# Patient Record
Sex: Male | Born: 1970
Health system: Southern US, Community
[De-identification: ages and names within clinical notes are randomized; demographics above are authoritative.]

## PROBLEM LIST (undated history)

## (undated) DIAGNOSIS — M109 Gout, unspecified: Secondary | ICD-10-CM

## (undated) DIAGNOSIS — K219 Gastro-esophageal reflux disease without esophagitis: Secondary | ICD-10-CM

## (undated) DIAGNOSIS — F419 Anxiety disorder, unspecified: Secondary | ICD-10-CM

## (undated) HISTORY — DX: Anxiety disorder, unspecified: F41.9

## (undated) HISTORY — PX: MOUTH SURGERY: SHX715

## (undated) HISTORY — DX: Gastro-esophageal reflux disease without esophagitis: K21.9

## (undated) HISTORY — DX: Gout, unspecified: M10.9

---

## 2003-07-04 ENCOUNTER — Emergency Department (HOSPITAL_COMMUNITY): Admission: EM | Admit: 2003-07-04 | Discharge: 2003-07-04 | Payer: Self-pay | Admitting: Emergency Medicine

## 2005-06-18 IMAGING — CR DG CHEST 2V
2 series · 2 of 2 positions shown · non-contrast
Comparison: none

CLINICAL DATA: Chest tightness and dyspnea.  
 PA AND LATERAL CHEST
 No comparison.  
 The cardiomediastinal contours are normal.  The lungs are clear.  There is no pleural effusion or pneumothorax.  Osseous structures appear unremarkable.  
 IMPRESSION
 No active cardiopulmonary process demonstrated.

[view not recorded (1 of 2)]
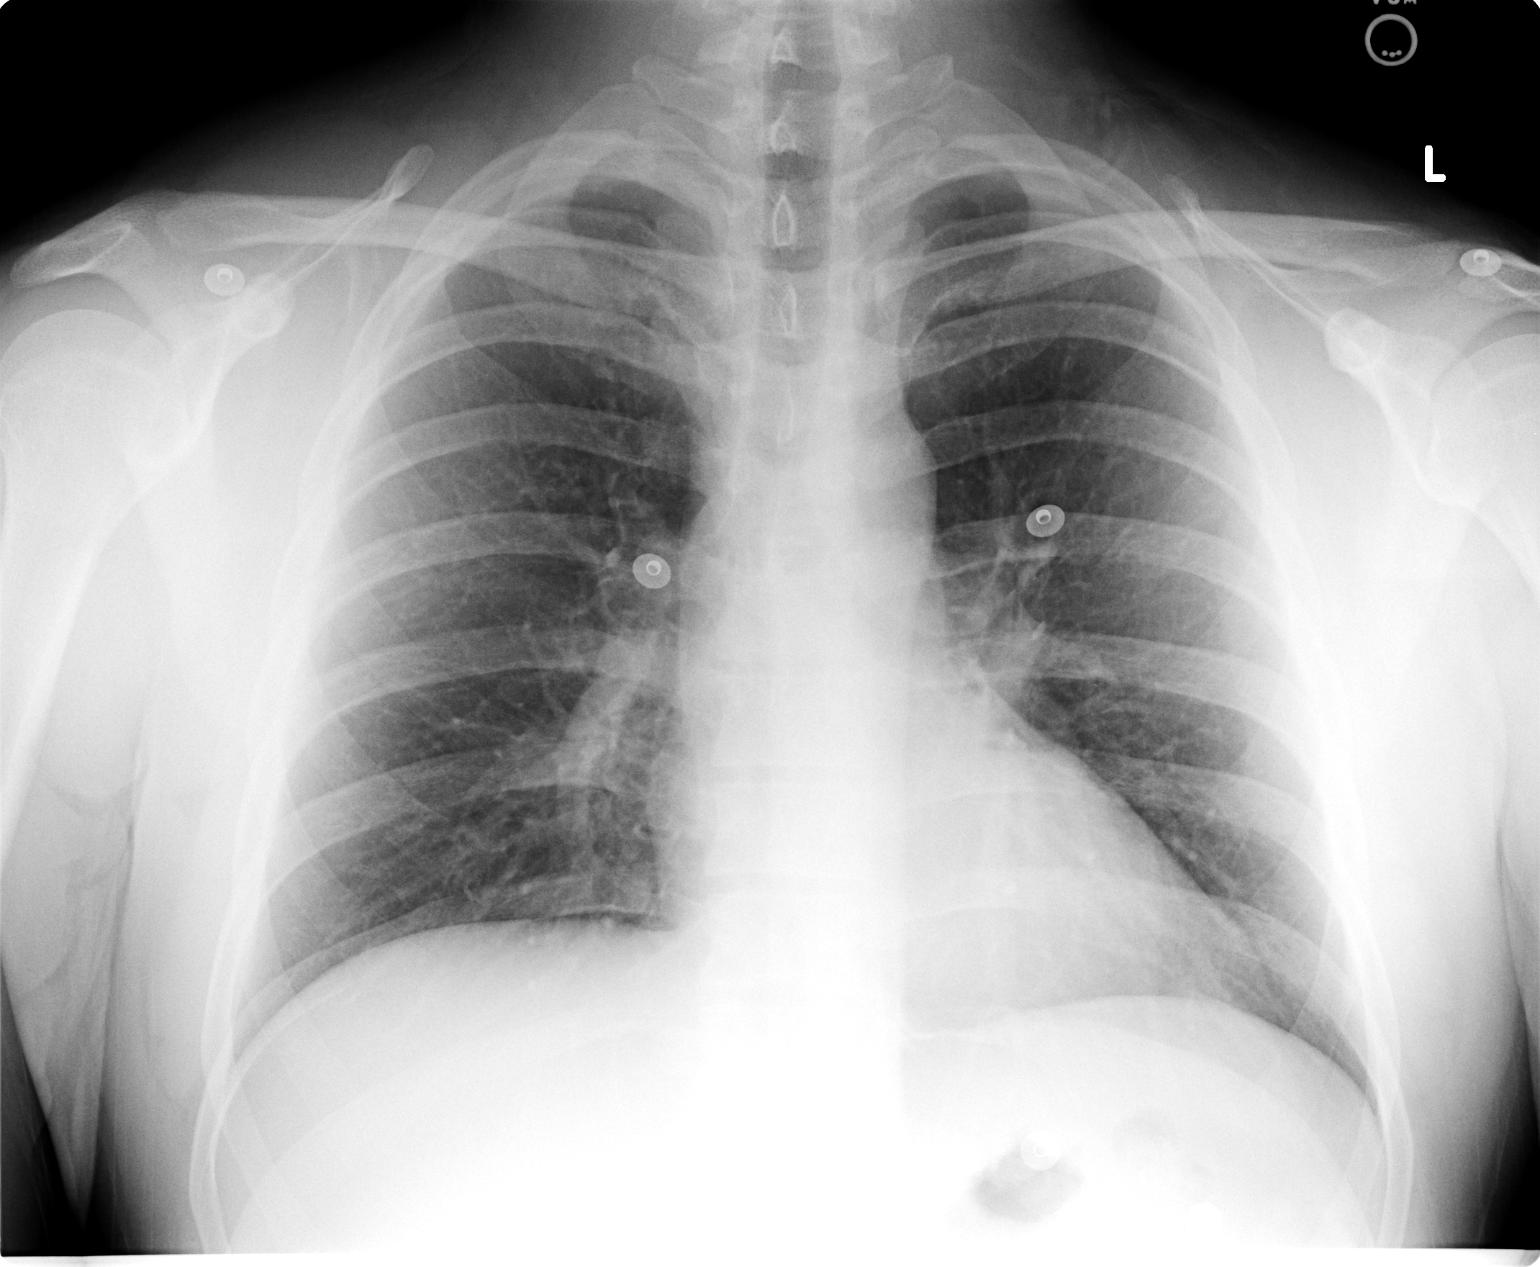

[view not recorded (2 of 2)]
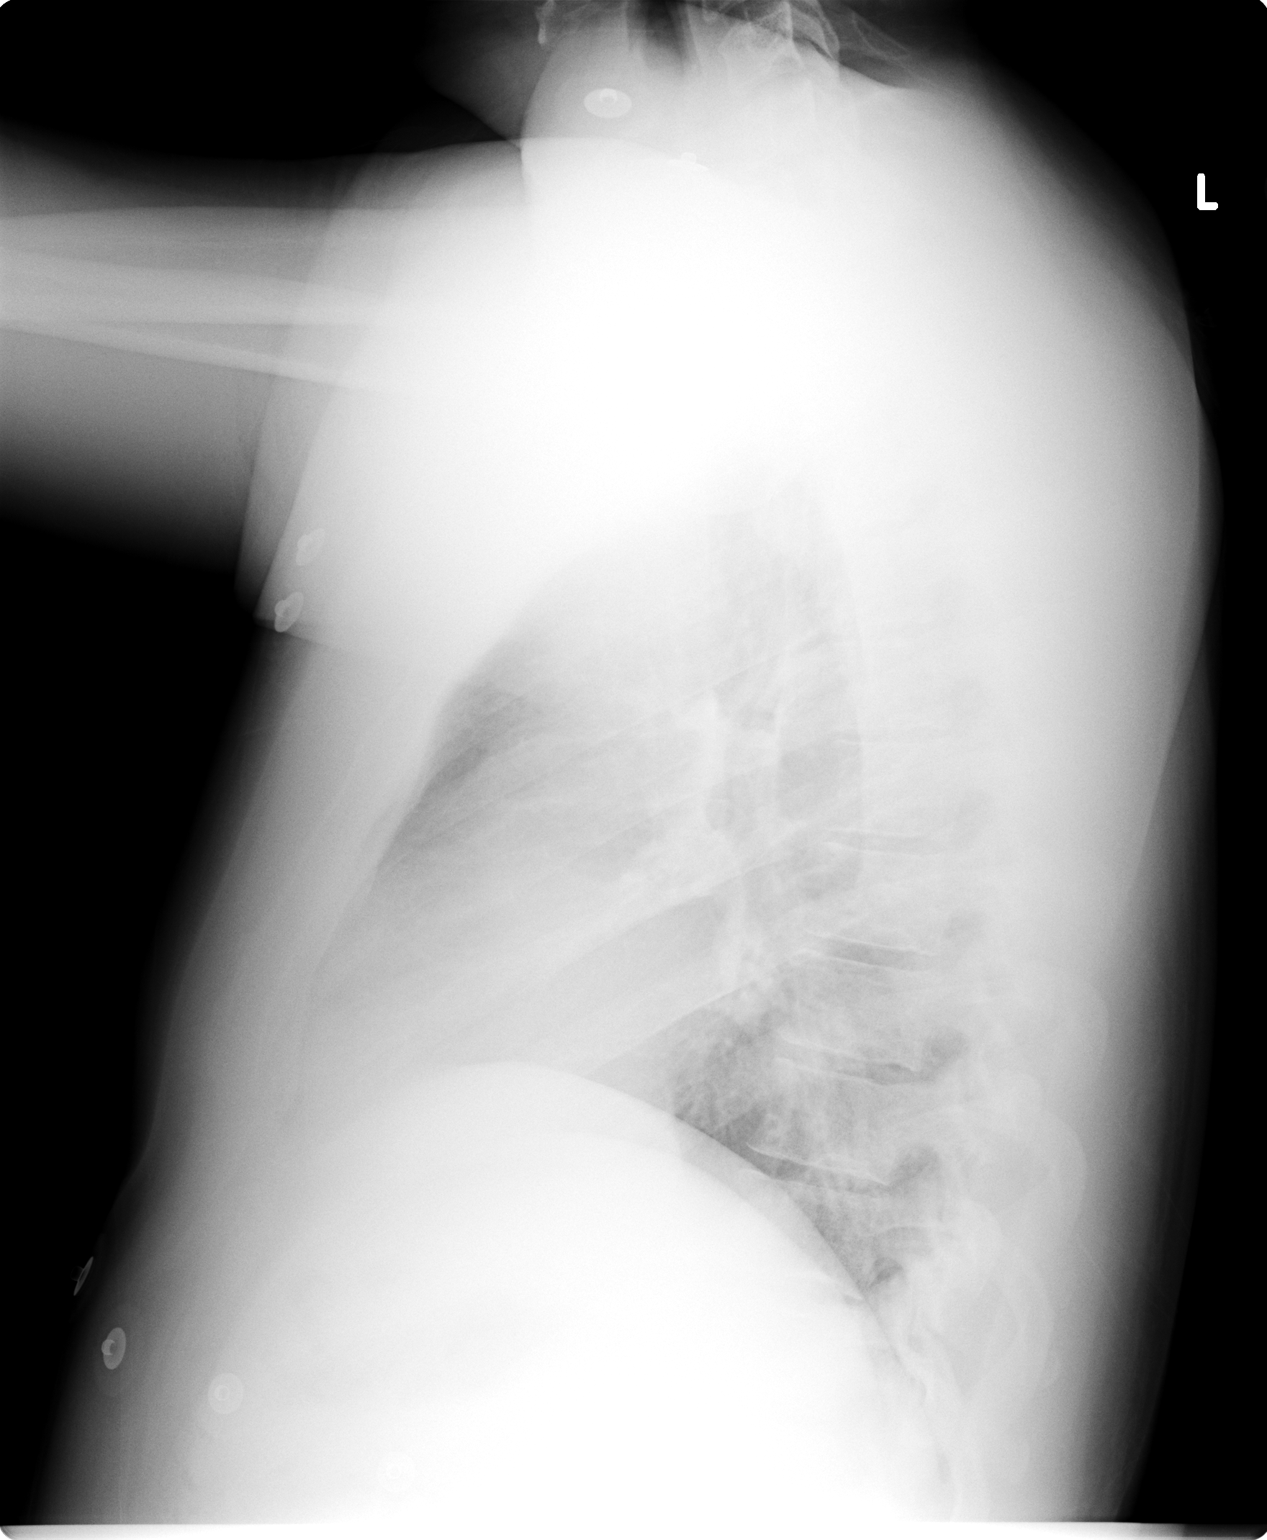

[2 of 2 positions shown; findings below may reference images not displayed]

## 2018-02-19 DIAGNOSIS — J3089 Other allergic rhinitis: Secondary | ICD-10-CM | POA: Diagnosis not present

## 2018-02-19 DIAGNOSIS — K219 Gastro-esophageal reflux disease without esophagitis: Secondary | ICD-10-CM | POA: Diagnosis not present

## 2018-02-19 DIAGNOSIS — Z0000 Encounter for general adult medical examination without abnormal findings: Secondary | ICD-10-CM | POA: Diagnosis not present

## 2018-03-02 DIAGNOSIS — J0100 Acute maxillary sinusitis, unspecified: Secondary | ICD-10-CM | POA: Diagnosis not present

## 2018-04-08 DIAGNOSIS — J111 Influenza due to unidentified influenza virus with other respiratory manifestations: Secondary | ICD-10-CM | POA: Diagnosis not present

## 2021-09-16 ENCOUNTER — Other Ambulatory Visit: Payer: Self-pay | Admitting: Oncology

## 2021-09-16 DIAGNOSIS — D582 Other hemoglobinopathies: Secondary | ICD-10-CM

## 2021-09-16 NOTE — Progress Notes (Signed)
[facility]  [address] [place],  [place]  [address] [phone]  Clinic Day:  09/17/2021  Referring physician: Gweneth Fritter, FNP   HISTORY OF PRESENT ILLNESS:  The patient is a 51 y.o. male  who I was asked to consult upon for polycythemia.  Recent labs showed an elevated hemoglobin and hematocrit of 19.7 and 57.4, respectively.  His platelets were also elevated at 528.  According to the patient, he was seen approximately 10 years ago for mild thrombocythemia in [place].  However, an exhaustive work-up was not done at that time.  The patient claims he went into emergency room 2 months ago when again having dizzy spells and numbness in his lips.  Labs at that time revealed polycythemia and thrombocytopenia.  Fortunately, those symptoms have not redeveloped.  He denies taking any type of testosterone or anabolic steroids.  He also denies being placed on any diuretics that can cause pseudopolycythemia.  He denies having undergone a previous splenectomy, which can lead to chronically elevated peripheral counts.  He also denies having any B symptoms which concern him for an underlying hematologic malignancy being behind his elevated peripheral counts.  Of note, his mother is currently battling multiple myeloma.  His maternal grandfather had leukemia.  PAST MEDICAL HISTORY:   Past Medical History:  Diagnosis Date   Anxiety    GERD (gastroesophageal reflux disease)    Gout     PAST SURGICAL HISTORY:  Oral surgery  CURRENT MEDICATIONS:   Current Outpatient Medications  Medication Sig Dispense Refill   Flaxseed, Linseed, (FLAX SEED OIL) 1000 MG CAPS Take by mouth.     fluticasone (FLONASE) 50 MCG/ACT nasal spray Administer 1 spray in each nostril daily.     Multiple Vitamin (MULTI-VITAMIN) tablet Take 1 tablet by mouth daily.     No current facility-administered medications for this visit.    ALLERGIES:   Allergies  Allergen Reactions    Ciprofloxacin Nausea And Vomiting   Hydromorphone Rash    FAMILY HISTORY:   Family History  Problem Relation Age of Onset   Multiple myeloma Mother    Hypertension Mother    Diabetes Mother    Heart failure Mother    CVA Mother    Alcohol abuse Father    Leukemia Maternal Grandfather     SOCIAL HISTORY:  The patient was born and raised in [place].  He currently lives in [place] with his wife of 25 years.  They have 2 children.  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  He drinks alcohol on rare occasions.  He did smoke a few cigarettes for 6 months before quitting 3 years ago.  REVIEW OF SYSTEMS:  Review of Systems  Constitutional:  Negative for fatigue, fever and unexpected weight change.  Respiratory:  Positive for cough. Negative for chest tightness, hemoptysis and shortness of breath.   Cardiovascular:  Negative for chest pain and palpitations.  Gastrointestinal:  Negative for abdominal distention, abdominal pain, blood in stool, constipation, diarrhea, nausea and vomiting.  Genitourinary:  Negative for dysuria, frequency and hematuria.   Musculoskeletal:  Negative for arthralgias, back pain and myalgias.  Skin:  Negative for itching and rash.  Neurological:  Positive for dizziness and headaches. Negative for light-headedness.  Psychiatric/Behavioral:  Negative for depression and suicidal ideas. The patient is not nervous/anxious.      PHYSICAL EXAM:  Blood pressure (!) 153/93, pulse 63, temperature 98.4 F (36.9 C), resp. rate 16, height 5'  11" (1.803 m), weight 221 lb (100.2 kg), SpO2 95 %. Wt Readings from Last 3 Encounters:  09/17/21 221 lb (100.2 kg)   Body mass index is 30.82 kg/m. Performance status (ECOG): 0 - Asymptomatic Physical Exam Constitutional:      Appearance: Normal appearance. He is not ill-appearing.  HENT:     Mouth/Throat:     Mouth: Mucous membranes are moist.     Pharynx: Oropharynx is clear. No oropharyngeal exudate or  posterior oropharyngeal erythema.  Cardiovascular:     Rate and Rhythm: Normal rate and regular rhythm.     Heart sounds: No murmur heard.    No friction rub. No gallop.  Pulmonary:     Effort: Pulmonary effort is normal. No respiratory distress.     Breath sounds: Normal breath sounds. No wheezing, rhonchi or rales.  Abdominal:     General: Bowel sounds are normal. There is no distension.     Palpations: Abdomen is soft. There is no mass.     Tenderness: There is no abdominal tenderness.  Musculoskeletal:        General: No swelling.     Right lower leg: No edema.     Left lower leg: No edema.  Lymphadenopathy:     Cervical: No cervical adenopathy.     Upper Body:     Right upper body: No supraclavicular or axillary adenopathy.     Left upper body: No supraclavicular or axillary adenopathy.     Lower Body: No right inguinal adenopathy. No left inguinal adenopathy.  Skin:    General: Skin is warm.     Coloration: Skin is not jaundiced.     Findings: No lesion or rash.  Neurological:     General: No focal deficit present.     Mental Status: He is alert and oriented to person, place, and time. Mental status is at baseline.  Psychiatric:        Mood and Affect: Mood normal.        Behavior: Behavior normal.        Thought Content: Thought content normal.     LABS:        Latest Ref Rng & Units 09/17/2021   12:00 AM  CMP  BUN 4 - 21 11      Creatinine 0.6 - 1.3 1.0      Sodium 137 - 147 138      Potassium 3.5 - 5.1 mEq/L 4.0      Chloride 99 - 108 101      CO2 13 - 22 29      Calcium 8.7 - 10.7 9.4      Alkaline Phos 25 - 125 71      AST 14 - 40 38      ALT 10 - 40 U/L 34         This result is from an external source.    ASSESSMENT & PLAN:  A 51 y.o. male who I was asked to consult upon for polycythemia.  As mentioned previously thrombocythemia has also been an issue.  As 2 of his 3 major blood cell lines are elevated, I will do JAK2 mutation today to ensure  polycythemia rubra vera is not present.  I will also have him tested for calreticulin and MPL mutations, particularly if his JAK2 mutation testing comes back negative.  My overall concern is an underlying myeloproliferative disorder may be present.  I will also check his iron parameters today to ensure that his  elevated hemoglobin is not due to hemochromatosis, which can occasionally lead to polycythemia.  Based upon his history, he is not on any medicines which are known to precipitate secondary polycythemia.  He also appears to not be dehydrated, which can cause polycythemia.  He also has not had a splenectomy, which can lead to chronic polycythemia/panmyelosis.  I will see this patient back in 2 weeks to go over all of his labs collected today, which will be used to formulate his next course of action as it pertains to his polycythemia and thrombocythemia.  The patient understands all the plans discussed today and is in agreement with them.  I do appreciate Goins, Gillis Santa, FNP for his new consult.   Lin Glazier Macarthur Critchley, MD

## 2021-09-17 ENCOUNTER — Inpatient Hospital Stay: Payer: No Typology Code available for payment source | Attending: Oncology | Admitting: Oncology

## 2021-09-17 ENCOUNTER — Inpatient Hospital Stay: Payer: No Typology Code available for payment source

## 2021-09-17 ENCOUNTER — Other Ambulatory Visit: Payer: Self-pay | Admitting: Oncology

## 2021-09-17 ENCOUNTER — Encounter: Payer: Self-pay | Admitting: Oncology

## 2021-09-17 DIAGNOSIS — D75839 Thrombocytosis, unspecified: Secondary | ICD-10-CM | POA: Diagnosis not present

## 2021-09-17 DIAGNOSIS — Z833 Family history of diabetes mellitus: Secondary | ICD-10-CM | POA: Diagnosis not present

## 2021-09-17 DIAGNOSIS — R059 Cough, unspecified: Secondary | ICD-10-CM

## 2021-09-17 DIAGNOSIS — D751 Secondary polycythemia: Secondary | ICD-10-CM | POA: Diagnosis present

## 2021-09-17 DIAGNOSIS — D582 Other hemoglobinopathies: Secondary | ICD-10-CM

## 2021-09-17 DIAGNOSIS — D45 Polycythemia vera: Secondary | ICD-10-CM

## 2021-09-17 DIAGNOSIS — Z808 Family history of malignant neoplasm of other organs or systems: Secondary | ICD-10-CM | POA: Diagnosis not present

## 2021-09-17 DIAGNOSIS — R519 Headache, unspecified: Secondary | ICD-10-CM

## 2021-09-17 DIAGNOSIS — Z806 Family history of leukemia: Secondary | ICD-10-CM | POA: Diagnosis not present

## 2021-09-17 DIAGNOSIS — Z79899 Other long term (current) drug therapy: Secondary | ICD-10-CM | POA: Insufficient documentation

## 2021-09-17 DIAGNOSIS — R42 Dizziness and giddiness: Secondary | ICD-10-CM

## 2021-09-17 DIAGNOSIS — Z8249 Family history of ischemic heart disease and other diseases of the circulatory system: Secondary | ICD-10-CM | POA: Diagnosis not present

## 2021-09-17 DIAGNOSIS — Z87891 Personal history of nicotine dependence: Secondary | ICD-10-CM | POA: Diagnosis not present

## 2021-09-17 DIAGNOSIS — Z811 Family history of alcohol abuse and dependence: Secondary | ICD-10-CM

## 2021-09-17 DIAGNOSIS — R200 Anesthesia of skin: Secondary | ICD-10-CM | POA: Diagnosis not present

## 2021-09-17 LAB — HEPATIC FUNCTION PANEL
ALT: 34 U/L (ref 10–40)
AST: 38 (ref 14–40)
Alkaline Phosphatase: 71 (ref 25–125)
Bilirubin, Total: 1.1

## 2021-09-17 LAB — BASIC METABOLIC PANEL
BUN: 11 (ref 4–21)
CO2: 29 — AB (ref 13–22)
Chloride: 101 (ref 99–108)
Creatinine: 1 (ref 0.6–1.3)
Glucose: 92
Potassium: 4 mEq/L (ref 3.5–5.1)
Sodium: 138 (ref 137–147)

## 2021-09-17 LAB — CBC AND DIFFERENTIAL
HCT: 61 — AB (ref 41–53)
Hemoglobin: 20.4 — AB (ref 13.5–17.5)
Neutrophils Absolute: 6.29
Platelets: 468 10*3/uL — AB (ref 150–400)
WBC: 8.5

## 2021-09-17 LAB — IRON AND TIBC
Iron: 121 ug/dL (ref 45–182)
Saturation Ratios: 23 % (ref 17.9–39.5)
TIBC: 523 ug/dL — ABNORMAL HIGH (ref 250–450)
UIBC: 402 ug/dL

## 2021-09-17 LAB — COMPREHENSIVE METABOLIC PANEL
Albumin: 4.8 (ref 3.5–5.0)
Calcium: 9.4 (ref 8.7–10.7)

## 2021-09-17 LAB — CBC: RBC: 6.84 — AB (ref 3.87–5.11)

## 2021-09-17 LAB — FERRITIN: Ferritin: 12 ng/mL — ABNORMAL LOW (ref 24–336)

## 2021-09-19 DIAGNOSIS — D751 Secondary polycythemia: Secondary | ICD-10-CM | POA: Diagnosis not present

## 2021-09-21 LAB — JAK2 GENOTYPR

## 2021-09-26 LAB — NGS JAK2 EXONS 12-15: E12-15 %: 49.45 %

## 2021-10-02 NOTE — Progress Notes (Signed)
[facility]  [address] [place],  [place]  [address] [phone]  Clinic Day:  10/03/2021  Referring physician: Gweneth Fritter, FNP   HISTORY OF PRESENT ILLNESS:  The patient is a 51 y.o. male  who I  recently began polycythemia.  He comes in today to go over all of his recent labs to determine the etiology behind this.  Since his last visit, the patient has been doing well.  Despite his elevated red cell indices, he denies having any recent headaches, dizziness, or other hyperviscosity-type symptoms related to his polycythemia.   PHYSICAL EXAM:  Blood pressure (!) 151/98, pulse 67, temperature 98.9 F (37.2 C), resp. rate 16, height '5\' 11"'$  (1.803 m), weight 220 lb 3.2 oz (99.9 kg), SpO2 95 %. Wt Readings from Last 3 Encounters:  10/03/21 220 lb 3.2 oz (99.9 kg)  09/17/21 221 lb (100.2 kg)   Body mass index is 30.71 kg/m. Performance status (ECOG): 0 - Asymptomatic Physical Exam Constitutional:      Appearance: Normal appearance. He is not ill-appearing.  HENT:     Mouth/Throat:     Mouth: Mucous membranes are moist.     Pharynx: Oropharynx is clear. No oropharyngeal exudate or posterior oropharyngeal erythema.  Cardiovascular:     Rate and Rhythm: Normal rate and regular rhythm.     Heart sounds: No murmur heard.    No friction rub. No gallop.  Pulmonary:     Effort: Pulmonary effort is normal. No respiratory distress.     Breath sounds: Normal breath sounds. No wheezing, rhonchi or rales.  Abdominal:     General: Bowel sounds are normal. There is no distension.     Palpations: Abdomen is soft. There is no mass.     Tenderness: There is no abdominal tenderness.  Musculoskeletal:        General: No swelling.     Right lower leg: No edema.     Left lower leg: No edema.  Lymphadenopathy:     Cervical: No cervical adenopathy.     Upper Body:     Right upper body: No supraclavicular or axillary adenopathy.     Left upper body: No  supraclavicular or axillary adenopathy.     Lower Body: No right inguinal adenopathy. No left inguinal adenopathy.  Skin:    General: Skin is warm.     Coloration: Skin is not jaundiced.     Findings: No lesion or rash.  Neurological:     General: No focal deficit present.     Mental Status: He is alert and oriented to person, place, and time. Mental status is at baseline.  Psychiatric:        Mood and Affect: Mood normal.        Behavior: Behavior normal.        Thought Content: Thought content normal.     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  Interpretation of this result should be made in the context of  other clinical, morphologic, and cytogenetic findings.    ASSESSMENT & PLAN:  A 51 y.o. male who I was asked to consult upon for polycythemia.  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  The patient understands this is a myeloproliferative disorder where his bone marrow uncontrollably makes too many red blood cells.  Ultimately, the goal of this disease is to get his hematocrit below 45.  Based upon this, I will arrange for him to be phlebotomized once monthly over these next few months to reach that goal.  She also knows to take a baby aspirin on a daily basis to offset any potential clotting complications this myeloproliferative disorder can cause.  Otherwise, I will see her back in 3 months to reassess his red cell indices to see how well he responded to his upcoming monthly phlebotomies.  The patient understands all the plans discussed today and is in agreement with them.  Cherlyn Syring Macarthur Critchley, MD

## 2021-10-03 ENCOUNTER — Telehealth: Payer: Self-pay | Admitting: Oncology

## 2021-10-03 ENCOUNTER — Inpatient Hospital Stay: Payer: No Typology Code available for payment source | Admitting: Oncology

## 2021-10-03 VITALS — BP 151/98 | HR 67 | Temp 98.9°F | Resp 16 | Ht 71.0 in | Wt 220.2 lb

## 2021-10-03 DIAGNOSIS — D45 Polycythemia vera: Secondary | ICD-10-CM

## 2021-10-03 NOTE — Telephone Encounter (Signed)
Per 10/03/21 los next appt scheduled and confirmed with patient 

## 2021-10-09 ENCOUNTER — Inpatient Hospital Stay: Payer: No Typology Code available for payment source

## 2021-10-09 VITALS — BP 139/95 | HR 68 | Temp 97.8°F | Resp 18

## 2021-10-09 DIAGNOSIS — D45 Polycythemia vera: Secondary | ICD-10-CM

## 2021-10-09 DIAGNOSIS — D751 Secondary polycythemia: Secondary | ICD-10-CM | POA: Diagnosis not present

## 2021-10-09 NOTE — Patient Instructions (Signed)

## 2021-10-09 NOTE — Progress Notes (Signed)
Gabriel Huynh presents today for phlebotomy per MD orders. Phlebotomy procedure started at 1422 and ended at 1445. 485 grams removed. Patient observed for 20 minutes after procedure without any incident. Patient tolerated procedure well. IV needle removed intact.

## 2021-11-09 ENCOUNTER — Inpatient Hospital Stay: Payer: No Typology Code available for payment source | Attending: Oncology

## 2021-11-09 VITALS — BP 130/88 | HR 63 | Temp 98.0°F | Resp 18

## 2021-11-09 DIAGNOSIS — D45 Polycythemia vera: Secondary | ICD-10-CM | POA: Insufficient documentation

## 2021-11-09 NOTE — Patient Instructions (Signed)

## 2021-11-09 NOTE — Progress Notes (Signed)
Gabriel Huynh presents today for phlebotomy per MD orders. Phlebotomy procedure started at 1405 and ended at 1422. 485 grams removed. Patient observed for 5 minutes after procedure without any incident. Patient tolerated procedure well. IV needle removed intact. LEFT AC

## 2021-12-10 ENCOUNTER — Inpatient Hospital Stay: Payer: No Typology Code available for payment source | Attending: Oncology

## 2021-12-10 VITALS — BP 139/81 | HR 88 | Temp 98.3°F | Resp 18 | Wt 223.0 lb

## 2021-12-10 DIAGNOSIS — D45 Polycythemia vera: Secondary | ICD-10-CM

## 2021-12-10 NOTE — Progress Notes (Signed)
Gabriel Huynh presents today for phlebotomy per MD orders. Phlebotomy procedure started at 1410  and ended at 1433 . 480 cc removed. Patient tolerated procedure well. IV needle removed intact.

## 2021-12-10 NOTE — Patient Instructions (Signed)

## 2022-01-03 ENCOUNTER — Ambulatory Visit: Payer: No Typology Code available for payment source | Admitting: Oncology

## 2022-01-03 ENCOUNTER — Inpatient Hospital Stay: Payer: No Typology Code available for payment source

## 2022-01-03 ENCOUNTER — Other Ambulatory Visit: Payer: Self-pay | Admitting: Oncology

## 2022-01-03 ENCOUNTER — Other Ambulatory Visit: Payer: No Typology Code available for payment source

## 2022-01-03 ENCOUNTER — Inpatient Hospital Stay (INDEPENDENT_AMBULATORY_CARE_PROVIDER_SITE_OTHER): Payer: No Typology Code available for payment source | Admitting: Oncology

## 2022-01-03 ENCOUNTER — Inpatient Hospital Stay: Payer: No Typology Code available for payment source | Attending: Oncology

## 2022-01-03 VITALS — BP 137/94 | HR 64 | Temp 98.9°F | Resp 18 | Wt 222.0 lb

## 2022-01-03 VITALS — BP 150/94 | HR 64 | Temp 97.9°F | Resp 16 | Ht 71.0 in | Wt 223.4 lb

## 2022-01-03 DIAGNOSIS — D45 Polycythemia vera: Secondary | ICD-10-CM | POA: Insufficient documentation

## 2022-01-03 DIAGNOSIS — Z79899 Other long term (current) drug therapy: Secondary | ICD-10-CM | POA: Insufficient documentation

## 2022-01-03 DIAGNOSIS — D471 Chronic myeloproliferative disease: Secondary | ICD-10-CM | POA: Insufficient documentation

## 2022-01-03 DIAGNOSIS — Z7982 Long term (current) use of aspirin: Secondary | ICD-10-CM | POA: Insufficient documentation

## 2022-01-03 LAB — CBC: RBC: 6.79 — AB (ref 3.87–5.11)

## 2022-01-03 LAB — CBC AND DIFFERENTIAL
HCT: 54 — AB (ref 41–53)
Hemoglobin: 17.8 — AB (ref 13.5–17.5)
Neutrophils Absolute: 6.31
Platelets: 503 10*3/uL — AB (ref 150–400)
WBC: 8.2

## 2022-01-03 NOTE — Patient Instructions (Addendum)
Return to Work Gabriel Huynh was treated at our facility today 01/02/22 Injury or illness was: ___Work-related. _x__Not work-related. ___Undetermined if work-related. Return to work Employee may return to work on today   Work activity restrictions: None    This information is not intended to replace advice given to you by your health care provider. Make sure you discuss any questions you have with your health care provider. Document Revised: 11/22/2020 Document Reviewed: 11/22/2020 Elsevier Patient Education  [name].

## 2022-01-03 NOTE — Progress Notes (Signed)
Gabriel Huynh presents today for phlebotomy per MD orders. Phlebotomy procedure started at 1512 and ended at 1517 500 grams removed. Patient stable at discharge  Patient tolerated procedure well. IV needle removed intact.

## 2022-01-03 NOTE — Progress Notes (Signed)
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  He has been receiving monthly phlebotomies over these past few months with the goal being to get his hematocrit less than 45.  He comes in today to reassess his red cell indices.  Since his last visit, the patient has been doing well.  He also has tolerated his monthly phlebotomies very well.  He denies having any hyperviscosity-type complications related to his underlying polycythemia rubra vera.  PHYSICAL EXAM:  Blood pressure (!) 150/94, pulse 64, temperature 97.9 F (36.6 C), resp. rate 16, height '5\' 11"'$  (1.803 m), weight 223 lb 6.4 oz (101.3 kg), SpO2 95 %. Wt Readings from Last 3 Encounters:  01/03/22 222 lb (100.7 kg)  01/03/22 223 lb 6.4 oz (101.3 kg)  12/10/21 223 lb (101.2 kg)   Body mass index is 31.16 kg/m. Performance status (ECOG): 0 - Asymptomatic Physical Exam Constitutional:      Appearance: Normal appearance. He is not ill-appearing.  HENT:     Mouth/Throat:     Mouth: Mucous membranes are moist.     Pharynx: Oropharynx is clear. No oropharyngeal exudate or posterior oropharyngeal erythema.  Cardiovascular:     Rate and Rhythm: Normal rate and regular rhythm.     Heart sounds: No murmur heard.    No friction rub. No gallop.  Pulmonary:     Effort: Pulmonary effort is normal. No respiratory distress.     Breath sounds: Normal breath sounds. No wheezing, rhonchi or rales.  Abdominal:     General: Bowel sounds are normal. There is no distension.     Palpations: Abdomen is soft. There is no mass.     Tenderness: There is no abdominal tenderness.  Musculoskeletal:        General: No swelling.     Right lower leg: No edema.     Left lower leg: No  edema.  Lymphadenopathy:     Cervical: No cervical adenopathy.     Upper Body:     Right upper body: No supraclavicular or axillary adenopathy.     Left upper body: No supraclavicular or axillary adenopathy.     Lower Body: No right inguinal adenopathy. No left inguinal adenopathy.  Skin:    General: Skin is warm.     Coloration: Skin is not jaundiced.     Findings: No lesion or rash.  Neurological:     General: No focal deficit present.     Mental Status: He is alert and oriented to person, place, and time. Mental status is at baseline.  Psychiatric:        Mood and Affect: Mood normal.        Behavior: Behavior normal.        Thought Content: Thought content normal.    LABS:    Latest Reference Range & Units 01/03/22 00:00  WBC  8.2 (E)  RBC 3.87 - 5.11  6.79 ! (E)  Hemoglobin 13.5 - 17.5  17.8 ! (E)  HCT 41 - 53  54 ! (E)  Platelets 150 - 400 K/uL 503 ! 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  Interpretation of this result should be made in the context of  other clinical, morphologic, and cytogenetic findings.    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  Although his red cell indices are better today after his monthly phlebotomies, he is still not at the goal level, which is to get his hematocrit below 45.  Based upon this, he will continue to receive monthly phlebotomies over these next few months.  He also knows to continue taking a baby aspirin on a daily basis to offset any potential clotting complications his myeloproliferative disorder can cause.  I will see him back in 3 months to reassess his polycythemia rubra vera. The patient understands all the plans discussed today and is in  agreement with them.  Gabriel Odwyer Macarthur Critchley, MD

## 2022-01-31 ENCOUNTER — Inpatient Hospital Stay: Payer: No Typology Code available for payment source | Attending: Oncology

## 2022-01-31 VITALS — BP 136/81 | HR 70 | Temp 98.2°F | Resp 18

## 2022-01-31 DIAGNOSIS — Z79899 Other long term (current) drug therapy: Secondary | ICD-10-CM | POA: Insufficient documentation

## 2022-01-31 DIAGNOSIS — D45 Polycythemia vera: Secondary | ICD-10-CM | POA: Diagnosis present

## 2022-01-31 NOTE — Progress Notes (Signed)
Gabriel Huynh presents today for phlebotomy per MD orders. Phlebotomy procedure started at 1410 and ended at 1420. 508 grams removed. Patient observed for 10 min after procedure without any incident. Patient tolerated procedure well. IV needle removed intact. LEFT AC

## 2022-01-31 NOTE — Patient Instructions (Signed)

## 2022-02-28 ENCOUNTER — Inpatient Hospital Stay: Payer: No Typology Code available for payment source

## 2022-03-01 ENCOUNTER — Inpatient Hospital Stay: Payer: No Typology Code available for payment source | Attending: Oncology

## 2022-03-01 VITALS — BP 149/87 | HR 80 | Temp 98.0°F | Resp 18

## 2022-03-01 DIAGNOSIS — D45 Polycythemia vera: Secondary | ICD-10-CM | POA: Insufficient documentation

## 2022-03-01 DIAGNOSIS — Z79899 Other long term (current) drug therapy: Secondary | ICD-10-CM | POA: Diagnosis not present

## 2022-03-01 NOTE — Patient Instructions (Signed)

## 2022-04-05 ENCOUNTER — Inpatient Hospital Stay: Payer: No Typology Code available for payment source | Attending: Oncology

## 2022-04-05 ENCOUNTER — Inpatient Hospital Stay: Payer: No Typology Code available for payment source | Admitting: Oncology

## 2022-04-05 ENCOUNTER — Inpatient Hospital Stay: Payer: No Typology Code available for payment source

## 2022-04-05 VITALS — BP 153/94 | HR 89 | Temp 98.9°F | Resp 16 | Ht 71.0 in | Wt 227.2 lb

## 2022-04-05 VITALS — BP 162/100 | HR 78 | Temp 98.0°F | Resp 18

## 2022-04-05 DIAGNOSIS — D45 Polycythemia vera: Secondary | ICD-10-CM | POA: Diagnosis not present

## 2022-04-05 LAB — CBC AND DIFFERENTIAL
HCT: 51 (ref 41–53)
Hemoglobin: 16.9 (ref 13.5–17.5)
Neutrophils Absolute: 6.05
Platelets: 547 10*3/uL — AB (ref 150–400)
WBC: 8.4

## 2022-04-05 LAB — CBC: RBC: 7.33 — AB (ref 3.87–5.11)

## 2022-04-05 NOTE — Progress Notes (Signed)
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  He has been receiving monthly phlebotomies over these past few months with the goal being to get his hematocrit less than 45.  He comes in today to reassess his red cell indices.  Since his last visit, the patient has been doing well.  He has tolerated his monthly phlebotomies very well.  He denies having any hyperviscosity-type complications related to his underlying polycythemia rubra vera.  He also claims to feel better since he started his monthly phlebotomies.    PHYSICAL EXAM:  Blood pressure (!) 153/94, pulse 89, temperature 98.9 F (37.2 C), resp. rate 16, height '5\' 11"'$  (1.803 m), weight 227 lb 3.2 oz (103.1 kg), SpO2 94 %. Wt Readings from Last 3 Encounters:  04/05/22 227 lb 3.2 oz (103.1 kg)  01/03/22 222 lb (100.7 kg)  01/03/22 223 lb 6.4 oz (101.3 kg)   Body mass index is 31.69 kg/m. Performance status (ECOG): 0 - Asymptomatic Physical Exam Constitutional:      Appearance: Normal appearance. He is not ill-appearing.  HENT:     Mouth/Throat:     Mouth: Mucous membranes are moist.     Pharynx: Oropharynx is clear. No oropharyngeal exudate or posterior oropharyngeal erythema.  Cardiovascular:     Rate and Rhythm: Normal rate and regular rhythm.     Heart sounds: No murmur heard.    No friction rub. No gallop.  Pulmonary:     Effort: Pulmonary effort is normal. No respiratory distress.     Breath sounds: Normal breath sounds. No wheezing, rhonchi or rales.  Abdominal:     General: Bowel sounds are normal. There is no distension.     Palpations: Abdomen is soft. There is no mass.     Tenderness: There is no abdominal tenderness.  Musculoskeletal:        General: No  swelling.     Right lower leg: No edema.     Left lower leg: No edema.  Lymphadenopathy:     Cervical: No cervical adenopathy.     Upper Body:     Right upper body: No supraclavicular or axillary adenopathy.     Left upper body: No supraclavicular or axillary adenopathy.     Lower Body: No right inguinal adenopathy. No left inguinal adenopathy.  Skin:    General: Skin is warm.     Coloration: Skin is not jaundiced.     Findings: No lesion or rash.  Neurological:     General: No focal deficit present.     Mental Status: He is alert and oriented to person, place, and time. Mental status is at baseline.  Psychiatric:        Mood and Affect: Mood normal.        Behavior: Behavior normal.        Thought Content: Thought content normal.    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  Interpretation of this result should be made in the context of  other clinical, morphologic, and  cytogenetic findings.    ASSESSMENT & PLAN:  A 52 y.o. 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  Although his red cell indices are better today after his monthly phlebotomies, he is still not at the goal level, which is to get his hematocrit below 45.  Based upon this, he will continue to receive monthly phlebotomies over these next few months.  He also knows to continue taking a baby aspirin on a daily basis to offset any potential clotting complications his myeloproliferative disorder can cause.  I will see him back in 3 months to reassess his polycythemia rubra vera. The patient understands all the plans discussed today and is in agreement with them.  Phill Steck Macarthur Critchley, MD

## 2022-04-05 NOTE — Progress Notes (Signed)
Elson Clan presents today for phlebotomy per MD orders. Phlebotomy procedure started at [facility] and ended at 1625. 491 grams removed. Patient observed for 10 minutes after procedure without any incident. Patient tolerated procedure well. IV needle removed intact. LEFT AC

## 2022-04-07 ENCOUNTER — Encounter: Payer: Self-pay | Admitting: Oncology

## 2022-05-03 ENCOUNTER — Inpatient Hospital Stay: Payer: No Typology Code available for payment source | Attending: Oncology

## 2022-05-03 VITALS — BP 121/80 | HR 68 | Temp 98.0°F | Resp 18

## 2022-05-03 DIAGNOSIS — D45 Polycythemia vera: Secondary | ICD-10-CM | POA: Diagnosis present

## 2022-05-03 NOTE — Patient Instructions (Signed)

## 2022-05-03 NOTE — Progress Notes (Signed)
Elson Clan presents today for phlebotomy per MD orders. Phlebotomy procedure started at 1505 and ended at 1515. 501 grams removed. Patient observed for 5 minutes after procedure without any incident. Patient tolerated procedure well. IV needle removed intact. LEFT AC

## 2022-05-31 ENCOUNTER — Inpatient Hospital Stay: Payer: No Typology Code available for payment source | Attending: Oncology

## 2022-05-31 VITALS — BP 127/81 | HR 79 | Temp 98.1°F | Resp 18 | Ht 71.0 in | Wt 217.0 lb

## 2022-05-31 DIAGNOSIS — D45 Polycythemia vera: Secondary | ICD-10-CM | POA: Diagnosis present

## 2022-05-31 NOTE — Patient Instructions (Signed)

## 2022-05-31 NOTE — Progress Notes (Signed)
Gabriel Huynh presents today for phlebotomy per MD orders. Phlebotomy procedure started at 1547 and ended at 1500. 482 grams removed. Patient observed for 5 minutes after procedure without any incident. Patient tolerated procedure well. IV needle removed intact. LEFT AC

## 2022-07-03 NOTE — Progress Notes (Signed)
[facility]  [address] [place],  [place]  [address] [phone]  Clinic Day:  07/04/2022  Referring physician: Audie Pinto, FNP  HISTORY OF PRESENT ILLNESS:  The patient is a 52 y.o. 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  He has been receiving monthly phlebotomies over these past few months with the goal being to get his hematocrit less than 45.  He comes in today to reassess his red cell indices.  Since his last visit, the patient has been doing well.  He has tolerated his monthly phlebotomies very well.  He denies having any hyperviscosity-type complications related to his underlying polycythemia rubra vera.  He also claims to feel better since he started his monthly phlebotomies.    PHYSICAL EXAM:  Blood pressure 138/89, pulse 95, temperature (!) 97.5 F (36.4 C), resp. rate 16, height  (1.803 m), weight 214 lb (97.1 kg), SpO2 92 %. Wt Readings from Last 3 Encounters:  07/04/22 214 lb (97.1 kg)  05/31/22 217 lb (98.4 kg)  04/05/22 227 lb 3.2 oz (103.1 kg)   Body mass index is 29.85 kg/m. Performance status (ECOG): 0 - Asymptomatic Physical Exam Constitutional:      Appearance: Normal appearance. He is not ill-appearing.  HENT:     Mouth/Throat:     Mouth: Mucous membranes are moist.     Pharynx: Oropharynx is clear. No oropharyngeal exudate or posterior oropharyngeal erythema.  Cardiovascular:     Rate and Rhythm: Normal rate and regular rhythm.     Heart sounds: No murmur heard.    No friction rub. No gallop.  Pulmonary:     Effort: Pulmonary effort is normal. No respiratory distress.     Breath sounds: Normal breath sounds. No wheezing, rhonchi or rales.  Abdominal:     General: Bowel sounds are normal. There is no distension.     Palpations: Abdomen is soft. There is no mass.     Tenderness: There is no abdominal tenderness.  Musculoskeletal:        General: No swelling.      Right lower leg: No edema.     Left lower leg: No edema.  Lymphadenopathy:     Cervical: No cervical adenopathy.     Upper Body:     Right upper body: No supraclavicular or axillary adenopathy.     Left upper body: No supraclavicular or axillary adenopathy.     Lower Body: No right inguinal adenopathy. No left inguinal adenopathy.  Skin:    General: Skin is warm.     Coloration: Skin is not jaundiced.     Findings: No lesion or rash.  Neurological:     General: No focal deficit present.     Mental Status: He is alert and oriented to person, place, and time. Mental status is at baseline.  Psychiatric:        Mood and Affect: Mood normal.        Behavior: Behavior normal.        Thought Content: Thought content normal.    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  Interpretation of this result should be made in the context of  other clinical, morphologic, and cytogenetic findings.  ASSESSMENT & PLAN:  A 52 y.o. 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  Although his red cell indices are better today after his monthly phlebotomies, he is still not at the goal level, which is to get his hematocrit below 45.  Based upon this, he will continue be phlebotomized, but they will be spaced out to every 2 months.  He will be  phlebotomized today, with the next one being in June 2024.  I will see him back in 4 months to reassess his polycythemia rubra vera. The patient understands all the plans discussed today and is in agreement with them.  Sanda Dejoy Kirby Funk, MD

## 2022-07-04 ENCOUNTER — Inpatient Hospital Stay: Payer: No Typology Code available for payment source | Attending: Oncology

## 2022-07-04 ENCOUNTER — Inpatient Hospital Stay (INDEPENDENT_AMBULATORY_CARE_PROVIDER_SITE_OTHER): Payer: No Typology Code available for payment source | Admitting: Oncology

## 2022-07-04 ENCOUNTER — Inpatient Hospital Stay: Payer: No Typology Code available for payment source

## 2022-07-04 VITALS — BP 128/71 | HR 88 | Temp 98.0°F | Resp 18

## 2022-07-04 VITALS — BP 138/89 | HR 95 | Temp 97.5°F | Resp 16 | Ht 71.0 in | Wt 214.0 lb

## 2022-07-04 DIAGNOSIS — D45 Polycythemia vera: Secondary | ICD-10-CM

## 2022-07-04 LAB — CBC AND DIFFERENTIAL
HCT: 49 (ref 41–53)
Hemoglobin: 15.4 (ref 13.5–17.5)
Neutrophils Absolute: 8.4
Platelets: 638 10*3/uL — AB (ref 150–400)
WBC: 10.5

## 2022-07-04 LAB — CBC: RBC: 7.19 — AB (ref 3.87–5.11)

## 2022-07-04 NOTE — Progress Notes (Signed)
Gabriel Huynh presents today for phlebotomy per MD orders. Phlebotomy procedure started at 1505 and ended at 1522. 502 grams removed. Patient observed for 5 minutes after procedure without any incident. Patient tolerated procedure well. IV needle removed intact. LEFT AC

## 2022-07-04 NOTE — Patient Instructions (Signed)

## 2022-07-06 ENCOUNTER — Encounter: Payer: Self-pay | Admitting: Oncology

## 2022-09-03 ENCOUNTER — Inpatient Hospital Stay: Payer: No Typology Code available for payment source | Attending: Oncology

## 2022-09-03 VITALS — BP 138/89 | HR 80 | Temp 98.4°F | Resp 16 | Ht 71.0 in | Wt 219.1 lb

## 2022-09-03 DIAGNOSIS — D45 Polycythemia vera: Secondary | ICD-10-CM | POA: Diagnosis present

## 2022-09-03 NOTE — Patient Instructions (Signed)

## 2022-09-03 NOTE — Progress Notes (Signed)
Gabriel Huynh presents today for phlebotomy per MD orders. Phlebotomy procedure started at 1515 and ended at 1528. 485 grams removed. Patient observed after procedure without any incident. Patient tolerated procedure well. IV needle removed intact LEFT AC

## 2022-10-28 ENCOUNTER — Encounter: Payer: Self-pay | Admitting: Oncology

## 2022-11-03 NOTE — Progress Notes (Unsigned)
[facility]  [address] [place],  [place]  [address] [phone]  Clinic Day:  11/04/2022  Referring physician: Audie Pinto, FNP  HISTORY OF PRESENT ILLNESS:  The patient is a 52 y.o. 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  He has been receiving bimonthly phlebotomies over these past few months with the goal being to get his hematocrit less than 45.  He comes in today to reassess his red cell indices.  Since his last visit, the patient has been doing well.  He has tolerated his i phlebotomies very well.  He denies having any hyperviscosity-type complications related to his underlying polycythemia rubra vera.  He also claims to feel better since he started his phlebotomies.    PHYSICAL EXAM:  Blood pressure (!) 146/89, pulse 71, temperature 98.5 F (36.9 C), resp. rate 18, height 5\' 11"  (1.803 m), weight 223 lb 8 oz (101.4 kg), SpO2 94%. Wt Readings from Last 3 Encounters:  11/04/22 223 lb 0.6 oz (101.2 kg)  11/04/22 223 lb 8 oz (101.4 kg)  09/03/22 219 lb 1.3 oz (99.4 kg)   Body mass index is 31.17 kg/m. Performance status (ECOG): 0 - Asymptomatic Physical Exam Constitutional:      Appearance: Normal appearance. He is not ill-appearing.  HENT:     Mouth/Throat:     Mouth: Mucous membranes are moist.     Pharynx: Oropharynx is clear. No oropharyngeal exudate or posterior oropharyngeal erythema.  Cardiovascular:     Rate and Rhythm: Normal rate and regular rhythm.     Heart sounds: No murmur heard.    No friction rub. No gallop.  Pulmonary:     Effort: Pulmonary effort is normal. No respiratory distress.     Breath sounds: Normal breath sounds. No wheezing, rhonchi or rales.  Abdominal:     General: Bowel sounds are normal. There is no distension.     Palpations: Abdomen is soft. There is no mass.     Tenderness: There is no abdominal tenderness.  Musculoskeletal:        General: No swelling.      Right lower leg: No edema.     Left lower leg: No edema.  Lymphadenopathy:     Cervical: No cervical adenopathy.     Upper Body:     Right upper body: No supraclavicular or axillary adenopathy.     Left upper body: No supraclavicular or axillary adenopathy.     Lower Body: No right inguinal adenopathy. No left inguinal adenopathy.  Skin:    General: Skin is warm.     Coloration: Skin is not jaundiced.     Findings: No lesion or rash.  Neurological:     General: No focal deficit present.     Mental Status: He is alert and oriented to person, place, and time. Mental status is at baseline.  Psychiatric:        Mood and Affect: Mood normal.        Behavior: Behavior normal.        Thought Content: Thought content normal.    LABS:    Latest Reference Range & Units 11/04/22 00:00  WBC  9.1 (E)  RBC 3.87 - 5.11  6.78 ! (E)  Hemoglobin 13.5 - 17.5  14.6 (E)  HCT 41 - 53  46 (E)  Platelets 150 - 400 K/uL 633 ! 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  Interpretation of this result should be made in the context of  other clinical, morphologic, and cytogenetic findings.    ASSESSMENT & PLAN:  A 52 y.o. 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  As his hematocrit is very close to being below 45, I will arrange for him to be phlebotomized today.  I will not plan any more phlebotomies over these next handful of months.  I will see him back in 4 months to reassess the status of his polycythemia rubra vera..  The patient understands all the plans discussed today and is in agreement with them.  Adalynne Steffensmeier Kirby Funk, MD

## 2022-11-04 ENCOUNTER — Inpatient Hospital Stay: Payer: Managed Care, Other (non HMO)

## 2022-11-04 ENCOUNTER — Other Ambulatory Visit: Payer: Self-pay | Admitting: Oncology

## 2022-11-04 ENCOUNTER — Inpatient Hospital Stay: Payer: Managed Care, Other (non HMO) | Attending: Oncology

## 2022-11-04 ENCOUNTER — Inpatient Hospital Stay: Payer: Managed Care, Other (non HMO) | Admitting: Oncology

## 2022-11-04 VITALS — BP 128/73 | HR 65 | Temp 98.2°F | Resp 18 | Ht 71.0 in | Wt 223.0 lb

## 2022-11-04 VITALS — BP 146/89 | HR 71 | Temp 98.5°F | Resp 18 | Ht 71.0 in | Wt 223.5 lb

## 2022-11-04 DIAGNOSIS — D45 Polycythemia vera: Secondary | ICD-10-CM

## 2022-11-04 LAB — CBC AND DIFFERENTIAL
HCT: 46 (ref 41–53)
Hemoglobin: 14.6 (ref 13.5–17.5)
Neutrophils Absolute: 6.73
Platelets: 633 10*3/uL — AB (ref 150–400)
WBC: 9.1

## 2022-11-04 LAB — CBC: RBC: 6.78 — AB (ref 3.87–5.11)

## 2022-11-04 NOTE — Progress Notes (Unsigned)
Gabriel Huynh presents today for phlebotomy per MD orders. Phlebotomy procedure started at 1515 and ended at 1525. 502 grams removed. Patient observed for 8 minutes after procedure without any incident. Patient tolerated procedure well. IV needle removed intact. LEFT AC

## 2023-03-05 NOTE — Progress Notes (Unsigned)
[facility]  [address] [place],  [place]  [address] [phone]  Clinic Day:  11/04/2022  Referring physician: Audie Pinto, FNP  HISTORY OF PRESENT ILLNESS:  The patient is a 52 y.o. 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  He has been receiving bimonthly phlebotomies over these past few months with the goal being to get his hematocrit less than 45.  He comes in today to reassess his red cell indices.  Since his last visit, the patient has been doing well.  He has tolerated his i phlebotomies very well.  He denies having any hyperviscosity-type complications related to his underlying polycythemia rubra vera.  He also claims to feel better since he started his phlebotomies.    PHYSICAL EXAM:  There were no vitals taken for this visit. Wt Readings from Last 3 Encounters:  11/04/22 223 lb 0.6 oz (101.2 kg)  11/04/22 223 lb 8 oz (101.4 kg)  09/03/22 219 lb 1.3 oz (99.4 kg)   There is no height or weight on file to calculate BMI. Performance status (ECOG): 0 - Asymptomatic Physical Exam Constitutional:      Appearance: Normal appearance. He is not ill-appearing.  HENT:     Mouth/Throat:     Mouth: Mucous membranes are moist.     Pharynx: Oropharynx is clear. No oropharyngeal exudate or posterior oropharyngeal erythema.  Cardiovascular:     Rate and Rhythm: Normal rate and regular rhythm.     Heart sounds: No murmur heard.    No friction rub. No gallop.  Pulmonary:     Effort: Pulmonary effort is normal. No respiratory distress.     Breath sounds: Normal breath sounds. No wheezing, rhonchi or rales.  Abdominal:     General: Bowel sounds are normal. There is no distension.     Palpations: Abdomen is soft. There is no mass.     Tenderness: There is no abdominal tenderness.  Musculoskeletal:        General: No swelling.     Right lower leg: No edema.     Left lower leg: No edema.  Lymphadenopathy:      Cervical: No cervical adenopathy.     Upper Body:     Right upper body: No supraclavicular or axillary adenopathy.     Left upper body: No supraclavicular or axillary adenopathy.     Lower Body: No right inguinal adenopathy. No left inguinal adenopathy.  Skin:    General: Skin is warm.     Coloration: Skin is not jaundiced.     Findings: No lesion or rash.  Neurological:     General: No focal deficit present.     Mental Status: He is alert and oriented to person, place, and time. Mental status is at baseline.  Psychiatric:        Mood and Affect: Mood normal.        Behavior: Behavior normal.        Thought Content: Thought content normal.    LABS:    Latest Reference Range & Units 11/04/22 00:00  WBC  9.1 (E)  RBC 3.87 - 5.11  6.78 ! (E)  Hemoglobin 13.5 - 17.5  14.6 (E)  HCT 41 - 53  46 (E)  Platelets 150 - 400 K/uL 633 ! (E)  NEUT#  6.73 (E)  !: Data is abnormal (E): External lab result  JAK2 GenotypR Comment Abnormal    Comment: (NOTE)  Result:  POSITIVE  for the detection of the V617F mutation.  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  Interpretation of this result should be made in the context of  other clinical, morphologic, and cytogenetic findings.    ASSESSMENT & PLAN:  A 52 y.o. 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  As his hematocrit is very close to being below 45, I will arrange for him to be phlebotomized today.  I will not plan any more phlebotomies over these next handful of months.  I will see him back in 4 months to reassess the status of his polycythemia rubra vera..  The patient understands all the plans discussed today and is in agreement with them.  Atlanta Pelto Kirby Funk, MD

## 2023-03-06 ENCOUNTER — Inpatient Hospital Stay: Payer: Managed Care, Other (non HMO)

## 2023-03-06 ENCOUNTER — Inpatient Hospital Stay: Payer: Managed Care, Other (non HMO) | Attending: Oncology | Admitting: Oncology

## 2023-03-06 ENCOUNTER — Other Ambulatory Visit: Payer: Self-pay | Admitting: Oncology

## 2023-03-06 ENCOUNTER — Telehealth: Payer: Self-pay | Admitting: Oncology

## 2023-03-06 VITALS — BP 151/111 | HR 77 | Temp 98.6°F | Resp 18 | Ht 71.0 in | Wt 229.7 lb

## 2023-03-06 DIAGNOSIS — D45 Polycythemia vera: Secondary | ICD-10-CM

## 2023-03-06 DIAGNOSIS — Z79899 Other long term (current) drug therapy: Secondary | ICD-10-CM | POA: Insufficient documentation

## 2023-03-06 LAB — CBC WITH DIFFERENTIAL (CANCER CENTER ONLY)
Abs Immature Granulocytes: 0.04 10*3/uL (ref 0.00–0.07)
Basophils Absolute: 0.1 10*3/uL (ref 0.0–0.1)
Basophils Relative: 1 %
Eosinophils Absolute: 0.3 10*3/uL (ref 0.0–0.5)
Eosinophils Relative: 3 %
HCT: 50.1 % (ref 39.0–52.0)
Hemoglobin: 15.3 g/dL (ref 13.0–17.0)
Immature Granulocytes: 0 %
Immature Platelet Fraction: 2.6 % (ref 1.2–8.6)
Lymphocytes Relative: 14 %
Lymphs Abs: 1.3 10*3/uL (ref 0.7–4.0)
MCH: 21 pg — ABNORMAL LOW (ref 26.0–34.0)
MCHC: 30.5 g/dL (ref 30.0–36.0)
MCV: 68.7 fL — ABNORMAL LOW (ref 80.0–100.0)
Monocytes Absolute: 0.5 10*3/uL (ref 0.1–1.0)
Monocytes Relative: 6 %
Neutro Abs: 6.9 10*3/uL (ref 1.7–7.7)
Neutrophils Relative %: 76 %
Platelet Count: 656 10*3/uL — ABNORMAL HIGH (ref 150–400)
RBC: 7.29 MIL/uL — ABNORMAL HIGH (ref 4.22–5.81)
RDW: 20.5 % — ABNORMAL HIGH (ref 11.5–15.5)
WBC Count: 9.2 10*3/uL (ref 4.0–10.5)
nRBC: 0 % (ref 0.0–0.2)
nRBC: 0 /100{WBCs}

## 2023-03-06 NOTE — Telephone Encounter (Signed)
03/06/23 Spoke with patient and confirmed next appt.

## 2023-03-06 NOTE — Progress Notes (Signed)
Gabriel Huynh presents today for phlebotomy per MD orders. Phlebotomy procedure started at 1100 and ended at 1115. 500 grams removed. Patient observed for 10 minutes after procedure without any incident. Patient tolerated procedure well. IV needle removed intact.  LEFT AC

## 2023-03-22 ENCOUNTER — Encounter: Payer: Self-pay | Admitting: Oncology

## 2023-04-07 ENCOUNTER — Inpatient Hospital Stay: Payer: Managed Care, Other (non HMO) | Attending: Oncology

## 2023-04-07 VITALS — BP 123/89 | HR 89 | Temp 98.0°F | Resp 18

## 2023-04-07 DIAGNOSIS — D45 Polycythemia vera: Secondary | ICD-10-CM | POA: Insufficient documentation

## 2023-04-07 DIAGNOSIS — Z79899 Other long term (current) drug therapy: Secondary | ICD-10-CM | POA: Insufficient documentation

## 2023-04-07 NOTE — Patient Instructions (Signed)

## 2023-04-07 NOTE — Progress Notes (Signed)
Gabriel Huynh presents today for phlebotomy per MD orders. Phlebotomy procedure started at 1510 and ended at 1520. 485 grams removed. Patient observed for 5 minutes after procedure without any incident. Patient tolerated procedure well. IV needle removed intact. LEFT AC

## 2023-05-08 ENCOUNTER — Inpatient Hospital Stay: Payer: Managed Care, Other (non HMO) | Attending: Oncology

## 2023-05-08 VITALS — BP 118/85 | HR 90 | Temp 98.0°F | Resp 18

## 2023-05-08 DIAGNOSIS — D45 Polycythemia vera: Secondary | ICD-10-CM | POA: Diagnosis present

## 2023-05-08 NOTE — Patient Instructions (Signed)

## 2023-06-04 NOTE — Progress Notes (Unsigned)
 [facility]  [address] [place],  [place]  [address] [phone]  Clinic Day:  06/05/2023  Referring physician: Audie Pinto, FNP  HISTORY OF PRESENT ILLNESS:  The patient is a 53 y.o. 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  Over these past months, he has been receiving monthly phlebotomies with the goal being to get his hematocrit below 45.  He comes in today to reassess his red cell indices.  Since his last visit, the patient has been doing well.  He has tolerated his phlebotomies very well.  He denies having any hyperviscosity-type complications related to his underlying polycythemia rubra vera.    PHYSICAL EXAM:  Blood pressure (!) 139/98, pulse 72, temperature 98.4 F (36.9 C), temperature source Oral, resp. rate 16, height 5\' 11"  (1.803 m), weight 229 lb 9.6 oz (104.1 kg), SpO2 97%. Wt Readings from Last 3 Encounters:  06/05/23 229 lb 9.6 oz (104.1 kg)  03/06/23 229 lb 11.2 oz (104.2 kg)  11/04/22 223 lb 0.6 oz (101.2 kg)   Body mass index is 32.02 kg/m. Performance status (ECOG): 0 - Asymptomatic Physical Exam Constitutional:      Appearance: Normal appearance. He is not ill-appearing.  HENT:     Mouth/Throat:     Mouth: Mucous membranes are moist.     Pharynx: Oropharynx is clear. No oropharyngeal exudate or posterior oropharyngeal erythema.  Cardiovascular:     Rate and Rhythm: Normal rate and regular rhythm.     Heart sounds: No murmur heard.    No friction rub. No gallop.  Pulmonary:     Effort: Pulmonary effort is normal. No respiratory distress.     Breath sounds: Normal breath sounds. No wheezing, rhonchi or rales.  Abdominal:     General: Bowel sounds are normal. There is no distension.     Palpations: Abdomen is soft. There is no mass.     Tenderness: There is no abdominal tenderness.  Musculoskeletal:        General: No swelling.     Right lower leg: No edema.     Left lower  leg: No edema.  Lymphadenopathy:     Cervical: No cervical adenopathy.     Upper Body:     Right upper body: No supraclavicular or axillary adenopathy.     Left upper body: No supraclavicular or axillary adenopathy.     Lower Body: No right inguinal adenopathy. No left inguinal adenopathy.  Skin:    General: Skin is warm.     Coloration: Skin is not jaundiced.     Findings: No lesion or rash.  Neurological:     General: No focal deficit present.     Mental Status: He is alert and oriented to person, place, and time. Mental status is at baseline.  Psychiatric:        Mood and Affect: Mood normal.        Behavior: Behavior normal.        Thought Content: Thought content normal.    LABS:    Latest Reference Range & Units 06/05/23 14:44  WBC 4.0 - 10.5 K/uL 9.2  RBC 4.22 - 5.81 MIL/uL 6.95 (H)  Hemoglobin 13.0 - 17.0 g/dL 52.8  HCT 41.3 - 24.4 % 47.9  MCV 80.0 - 100.0 fL 68.9 (L)  MCH 26.0 - 34.0 pg 20.6 (L)  MCHC 30.0 - 36.0 g/dL 01.0 (L)  RDW 27.2 - 53.6 % 19.9 (H)  Platelets 150 -  400 K/uL 683 (H)  nRBC 0.0 - 0.2 % 0 /100 WBC 0.00 0  Neutrophils % 74  Lymphocytes % 15  Monocytes Relative % 6  Eosinophil % 3  Basophil % 2  Immature Granulocytes % 0  (H): Data is abnormally high (L): Data is abnormally low  JAK2 GenotypR Comment Abnormal    Comment: (NOTE)  Result:  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  Interpretation of this result should be made in the context of  other clinical, morphologic, and cytogenetic findings.    ASSESSMENT & PLAN:  A 53 y.o. 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  Although his hematocrit is lower, it remains above 45.  Based upon this, he will be phlebotomized today and in 2 months.  When evaluating his other labs, his platelet count has been gradually rising over these past few months.  This may be related to  his underlying myeloproliferative disorder.  Another component of this may be that his platelets are reactively rising due to an iron deficiency that may be gradually developing as he is getting phlebotomized on a fairly routine basis.  His elevated platelets will continue to be followed over time.  The patient knows to continue taking aspirin on a daily basis to offset any potential clotting complications his polycythemia rubra vera can cause.  I will see him back in 4 months for repeat clinical assessment.  The patient understands all the plans discussed today and is in agreement with them.  Greysen Devino Kirby Funk, MD

## 2023-06-05 ENCOUNTER — Inpatient Hospital Stay: Payer: Managed Care, Other (non HMO) | Attending: Oncology | Admitting: Oncology

## 2023-06-05 ENCOUNTER — Other Ambulatory Visit: Payer: Self-pay | Admitting: Oncology

## 2023-06-05 ENCOUNTER — Inpatient Hospital Stay: Payer: Managed Care, Other (non HMO)

## 2023-06-05 VITALS — BP 139/98 | HR 72 | Temp 98.4°F | Resp 16 | Ht 71.0 in | Wt 229.6 lb

## 2023-06-05 VITALS — BP 125/91 | HR 81 | Resp 18

## 2023-06-05 DIAGNOSIS — D45 Polycythemia vera: Secondary | ICD-10-CM | POA: Diagnosis not present

## 2023-06-05 DIAGNOSIS — Z79899 Other long term (current) drug therapy: Secondary | ICD-10-CM | POA: Insufficient documentation

## 2023-06-05 LAB — CBC WITH DIFFERENTIAL (CANCER CENTER ONLY)
Abs Immature Granulocytes: 0.03 10*3/uL (ref 0.00–0.07)
Basophils Absolute: 0.1 10*3/uL (ref 0.0–0.1)
Basophils Relative: 2 %
Eosinophils Absolute: 0.3 10*3/uL (ref 0.0–0.5)
Eosinophils Relative: 3 %
HCT: 47.9 % (ref 39.0–52.0)
Hemoglobin: 14.3 g/dL (ref 13.0–17.0)
Immature Granulocytes: 0 %
Immature Platelet Fraction: 2.9 % (ref 1.2–8.6)
Lymphocytes Relative: 15 %
Lymphs Abs: 1.4 10*3/uL (ref 0.7–4.0)
MCH: 20.6 pg — ABNORMAL LOW (ref 26.0–34.0)
MCHC: 29.9 g/dL — ABNORMAL LOW (ref 30.0–36.0)
MCV: 68.9 fL — ABNORMAL LOW (ref 80.0–100.0)
Monocytes Absolute: 0.5 10*3/uL (ref 0.1–1.0)
Monocytes Relative: 6 %
Neutro Abs: 6.8 10*3/uL (ref 1.7–7.7)
Neutrophils Relative %: 74 %
Platelet Count: 683 10*3/uL — ABNORMAL HIGH (ref 150–400)
RBC: 6.95 MIL/uL — ABNORMAL HIGH (ref 4.22–5.81)
RDW: 19.9 % — ABNORMAL HIGH (ref 11.5–15.5)
WBC Count: 9.2 10*3/uL (ref 4.0–10.5)
nRBC: 0 % (ref 0.0–0.2)
nRBC: 0 /100{WBCs}

## 2023-06-05 NOTE — Progress Notes (Signed)
 Gabriel Huynh presents today for phlebotomy per MD orders. Phlebotomy procedure started at 1545 and ended at 1600. 481 grams removed. Patient observed for 5 minutes after procedure without any incident. Patient tolerated procedure well. IV needle removed intact. LEFT AC

## 2023-08-05 ENCOUNTER — Inpatient Hospital Stay: Attending: Oncology

## 2023-08-05 VITALS — BP 119/80 | HR 80 | Temp 98.0°F | Resp 18

## 2023-08-05 DIAGNOSIS — Z79899 Other long term (current) drug therapy: Secondary | ICD-10-CM | POA: Diagnosis not present

## 2023-08-05 DIAGNOSIS — D45 Polycythemia vera: Secondary | ICD-10-CM | POA: Diagnosis present

## 2023-08-05 NOTE — Progress Notes (Signed)
 Gabriel Huynh presents today for phlebotomy per MD orders. Phlebotomy procedure started at 1455 and ended at 1510. 480 grams removed. Patient observed for 5 minutes after procedure without any incident. Patient tolerated procedure well. IV needle removed intact. LEFT AC

## 2023-08-05 NOTE — Patient Instructions (Signed)

## 2023-10-02 NOTE — Progress Notes (Signed)
 [facility]  [address] [place],  [place]  [address] [phone]  Clinic Day:  10/03/2023  Referring physician: Jackolyn Darice BROCKS, FNP  HISTORY OF PRESENT ILLNESS:  The patient is a 53 y.o. 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  Over these past months, he has been receiving phlebotomies every 2 months with the goal being to get his hematocrit below 45.  He comes in today to reassess his red cell indices.  Since his last visit, the patient has been doing well.  He has tolerated his phlebotomies very well.  He denies having any hyperviscosity-type complications related to his underlying polycythemia rubra vera.    PHYSICAL EXAM:  Blood pressure 137/88, pulse 83, temperature 98.2 F (36.8 C), temperature source Oral, resp. rate 16, height 5' 11 (1.803 m), weight 231 lb 1.6 oz (104.8 kg), SpO2 92%. Wt Readings from Last 3 Encounters:  10/03/23 231 lb 1.6 oz (104.8 kg)  06/05/23 229 lb 9.6 oz (104.1 kg)  03/06/23 229 lb 11.2 oz (104.2 kg)   Body mass index is 32.23 kg/m. Performance status (ECOG): 0 - Asymptomatic Physical Exam Constitutional:      Appearance: Normal appearance. He is not ill-appearing.  HENT:     Mouth/Throat:     Mouth: Mucous membranes are moist.     Pharynx: Oropharynx is clear. No oropharyngeal exudate or posterior oropharyngeal erythema.  Cardiovascular:     Rate and Rhythm: Normal rate and regular rhythm.     Heart sounds: No murmur heard.    No friction rub. No gallop.  Pulmonary:     Effort: Pulmonary effort is normal. No respiratory distress.     Breath sounds: Normal breath sounds. No wheezing, rhonchi or rales.  Abdominal:     General: Bowel sounds are normal. There is no distension.     Palpations: Abdomen is soft. There is no mass.     Tenderness: There is no abdominal tenderness.  Musculoskeletal:        General: No swelling.     Right lower leg: No edema.     Left  lower leg: No edema.  Lymphadenopathy:     Cervical: No cervical adenopathy.     Upper Body:     Right upper body: No supraclavicular or axillary adenopathy.     Left upper body: No supraclavicular or axillary adenopathy.     Lower Body: No right inguinal adenopathy. No left inguinal adenopathy.  Skin:    General: Skin is warm.     Coloration: Skin is not jaundiced.     Findings: No lesion or rash.  Neurological:     General: No focal deficit present.     Mental Status: He is alert and oriented to person, place, and time. Mental status is at baseline.  Psychiatric:        Mood and Affect: Mood normal.        Behavior: Behavior normal.        Thought Content: Thought content normal.    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  Interpretation of this result should be made in the context of  other clinical, morphologic, and cytogenetic findings.    ASSESSMENT & PLAN:  A 53 y.o. 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  Unfortunately, this gentleman's hematocrit remains above 45.  Based upon this, I will increase his phlebotomies to where they will be once a month over these next few months.  I will see him back in 3 months to reassess his red cell indices to see how well he  responded to his upcoming monthly phlebotomies.  The patient understands all the plans discussed today and is in agreement with them.  [place] Yarbro DELENA Kerns, MD

## 2023-10-03 ENCOUNTER — Inpatient Hospital Stay: Attending: Oncology

## 2023-10-03 ENCOUNTER — Inpatient Hospital Stay

## 2023-10-03 ENCOUNTER — Inpatient Hospital Stay: Admitting: Oncology

## 2023-10-03 VITALS — BP 137/88 | HR 83 | Temp 98.2°F | Resp 16 | Ht 71.0 in | Wt 231.1 lb

## 2023-10-03 VITALS — BP 121/88 | HR 78 | Temp 98.2°F | Resp 18

## 2023-10-03 DIAGNOSIS — D45 Polycythemia vera: Secondary | ICD-10-CM | POA: Insufficient documentation

## 2023-10-03 DIAGNOSIS — Z79899 Other long term (current) drug therapy: Secondary | ICD-10-CM | POA: Diagnosis not present

## 2023-10-03 LAB — CBC WITH DIFFERENTIAL (CANCER CENTER ONLY)
Abs Immature Granulocytes: 0.02 K/uL (ref 0.00–0.07)
Basophils Absolute: 0.1 K/uL (ref 0.0–0.1)
Basophils Relative: 1 %
Eosinophils Absolute: 0.2 K/uL (ref 0.0–0.5)
Eosinophils Relative: 3 %
HCT: 48.6 % (ref 39.0–52.0)
Hemoglobin: 14.5 g/dL (ref 13.0–17.0)
Immature Granulocytes: 0 %
Immature Platelet Fraction: 2.9 % (ref 1.2–8.6)
Lymphocytes Relative: 16 %
Lymphs Abs: 1.3 K/uL (ref 0.7–4.0)
MCH: 20.7 pg — ABNORMAL LOW (ref 26.0–34.0)
MCHC: 29.8 g/dL — ABNORMAL LOW (ref 30.0–36.0)
MCV: 69.2 fL — ABNORMAL LOW (ref 80.0–100.0)
Monocytes Absolute: 0.4 K/uL (ref 0.1–1.0)
Monocytes Relative: 5 %
Neutro Abs: 6.2 K/uL (ref 1.7–7.7)
Neutrophils Relative %: 75 %
Platelet Count: 636 K/uL — ABNORMAL HIGH (ref 150–400)
RBC: 7.02 MIL/uL — ABNORMAL HIGH (ref 4.22–5.81)
RDW: 20.6 % — ABNORMAL HIGH (ref 11.5–15.5)
WBC Count: 8.3 K/uL (ref 4.0–10.5)
nRBC: 0 % (ref 0.0–0.2)

## 2023-10-03 NOTE — Progress Notes (Signed)
 Gabriel Huynh presents today for phlebotomy per MD orders. Phlebotomy procedure started at 1600 and ended at 1615. 499 grams removed. Patient observed for after procedure without any incident. Patient tolerated procedure well. IV needle removed intact. RIGHT AC

## 2023-10-06 ENCOUNTER — Encounter

## 2023-10-06 ENCOUNTER — Other Ambulatory Visit

## 2023-10-06 ENCOUNTER — Ambulatory Visit: Admitting: Oncology

## 2023-11-04 ENCOUNTER — Inpatient Hospital Stay: Attending: Oncology

## 2023-11-04 DIAGNOSIS — Z79899 Other long term (current) drug therapy: Secondary | ICD-10-CM | POA: Insufficient documentation

## 2023-11-04 DIAGNOSIS — D45 Polycythemia vera: Secondary | ICD-10-CM | POA: Insufficient documentation

## 2023-11-04 NOTE — Patient Instructions (Signed)

## 2023-11-04 NOTE — Progress Notes (Signed)
 Gabriel Huynh presents today for phlebotomy per MD orders. Phlebotomy procedure started at 1550 and ended at 1559. 500 grams removed. Patient observed for 30 minutes after procedure without any incident. Patient tolerated procedure well. IV needle removed intact.

## 2023-12-04 ENCOUNTER — Inpatient Hospital Stay: Attending: Oncology

## 2023-12-04 VITALS — BP 127/96 | HR 87 | Temp 97.8°F | Resp 18

## 2023-12-04 DIAGNOSIS — D45 Polycythemia vera: Secondary | ICD-10-CM | POA: Diagnosis present

## 2023-12-04 NOTE — Progress Notes (Signed)
 Gabriel Huynh presents today for phlebotomy per MD orders. Phlebotomy procedure started at 1550 and ended at 1605. 500 mL removed. Patient observed for 10 minutes after procedure without any incident. Patient tolerated procedure well. IV needle removed intact. LEFT AC #18

## 2023-12-04 NOTE — Patient Instructions (Signed)

## 2023-12-09 ENCOUNTER — Other Ambulatory Visit

## 2023-12-25 ENCOUNTER — Other Ambulatory Visit: Payer: Self-pay

## 2023-12-25 ENCOUNTER — Inpatient Hospital Stay: Attending: Oncology

## 2023-12-25 DIAGNOSIS — D45 Polycythemia vera: Secondary | ICD-10-CM | POA: Insufficient documentation

## 2023-12-25 DIAGNOSIS — Z79899 Other long term (current) drug therapy: Secondary | ICD-10-CM | POA: Insufficient documentation

## 2023-12-25 LAB — CBC WITH DIFFERENTIAL (CANCER CENTER ONLY)
Abs Immature Granulocytes: 0.04 K/uL (ref 0.00–0.07)
Basophils Absolute: 0.2 K/uL — ABNORMAL HIGH (ref 0.0–0.1)
Basophils Relative: 2 %
Eosinophils Absolute: 0.3 K/uL (ref 0.0–0.5)
Eosinophils Relative: 3 %
HCT: 44.2 % (ref 39.0–52.0)
Hemoglobin: 12.8 g/dL — ABNORMAL LOW (ref 13.0–17.0)
Immature Granulocytes: 1 %
Lymphocytes Relative: 14 %
Lymphs Abs: 1.2 K/uL (ref 0.7–4.0)
MCH: 19.6 pg — ABNORMAL LOW (ref 26.0–34.0)
MCHC: 29 g/dL — ABNORMAL LOW (ref 30.0–36.0)
MCV: 67.8 fL — ABNORMAL LOW (ref 80.0–100.0)
Monocytes Absolute: 0.6 K/uL (ref 0.1–1.0)
Monocytes Relative: 7 %
Neutro Abs: 6.3 K/uL (ref 1.7–7.7)
Neutrophils Relative %: 73 %
Platelet Count: 628 K/uL — ABNORMAL HIGH (ref 150–400)
RBC: 6.52 MIL/uL — ABNORMAL HIGH (ref 4.22–5.81)
RDW: 19.2 % — ABNORMAL HIGH (ref 11.5–15.5)
WBC Count: 8.4 K/uL (ref 4.0–10.5)
nRBC: 0 % (ref 0.0–0.2)

## 2024-01-01 NOTE — Progress Notes (Signed)
 [facility] at [facility] [address] [place],  [place]  [address] [phone]  Clinic Day:  01/02/2024  Referring physician: Jackolyn Darice BROCKS, FNP  HISTORY OF PRESENT ILLNESS:  The patient is a 53 y.o. 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  Over these past months, he has been receiving phlebotomies every 2 months with the goal being to get his hematocrit below 45.  He comes in today to reassess his red cell indices.  Since his last visit, the patient has been doing well.  He has tolerated his phlebotomies very well.  He denies having any hyperviscosity-type complications related to his underlying polycythemia rubra vera.    PHYSICAL EXAM:  Blood pressure 121/86, pulse 81, temperature 98.4 F (36.9 C), temperature source Oral, resp. rate 18, height 5' 11 (1.803 m), weight 228 lb 3.2 oz (103.5 kg), SpO2 96%. Wt Readings from Last 3 Encounters:  01/02/24 228 lb 3.2 oz (103.5 kg)  11/04/23 233 lb 4 oz (105.8 kg)  10/03/23 231 lb 1.6 oz (104.8 kg)   Body mass index is 31.83 kg/m. Performance status (ECOG): 0 - Asymptomatic Physical Exam Constitutional:      Appearance: Normal appearance. He is not ill-appearing.  HENT:     Mouth/Throat:     Mouth: Mucous membranes are moist.     Pharynx: Oropharynx is clear. No oropharyngeal exudate or posterior oropharyngeal erythema.  Cardiovascular:     Rate and Rhythm: Normal rate and regular rhythm.     Heart sounds: No murmur heard.    No friction rub. No gallop.  Pulmonary:     Effort: Pulmonary effort is normal. No respiratory distress.     Breath sounds: Normal breath sounds. No wheezing, rhonchi or rales.  Abdominal:     General: Bowel sounds are normal. There is no distension.     Palpations: Abdomen is soft. There is no mass.     Tenderness: There is no abdominal tenderness.  Musculoskeletal:        General: No swelling.     Right lower leg: No edema.     Left lower  leg: No edema.  Lymphadenopathy:     Cervical: No cervical adenopathy.     Upper Body:     Right upper body: No supraclavicular or axillary adenopathy.     Left upper body: No supraclavicular or axillary adenopathy.     Lower Body: No right inguinal adenopathy. No left inguinal adenopathy.  Skin:    General: Skin is warm.     Coloration: Skin is not jaundiced.     Findings: No lesion or rash.  Neurological:     General: No focal deficit present.     Mental Status: He is alert and oriented to person, place, and time. Mental status is at baseline.  Psychiatric:        Mood and Affect: Mood normal.        Behavior: Behavior normal.        Thought Content: Thought content normal.    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  Interpretation of this result should be made in the context of  other clinical, morphologic, and cytogenetic findings.    ASSESSMENT & PLAN:  A 53 y.o. 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  When evaluating this gentleman's labs today, his hematocrit is barely above 45.  Based upon this, I will arrange for him to be phlebotomized today, which should cover him over these next handful of months.  I will see him back in 3 months to reassess his red cell  indices.  The patient understands all the plans discussed today and is in agreement with them.  Klarisa Barman DELENA Kerns, MD

## 2024-01-02 ENCOUNTER — Inpatient Hospital Stay: Admitting: Oncology

## 2024-01-02 ENCOUNTER — Other Ambulatory Visit: Payer: Self-pay | Admitting: Oncology

## 2024-01-02 ENCOUNTER — Inpatient Hospital Stay

## 2024-01-02 ENCOUNTER — Telehealth: Payer: Self-pay | Admitting: Oncology

## 2024-01-02 VITALS — BP 130/67 | HR 75 | Resp 18

## 2024-01-02 VITALS — BP 121/86 | HR 81 | Temp 98.4°F | Resp 18 | Ht 71.0 in | Wt 228.2 lb

## 2024-01-02 DIAGNOSIS — D45 Polycythemia vera: Secondary | ICD-10-CM

## 2024-01-02 LAB — CBC WITH DIFFERENTIAL (CANCER CENTER ONLY)
Abs Immature Granulocytes: 0.04 K/uL (ref 0.00–0.07)
Basophils Absolute: 0.1 K/uL (ref 0.0–0.1)
Basophils Relative: 1 %
Eosinophils Absolute: 0.2 K/uL (ref 0.0–0.5)
Eosinophils Relative: 2 %
HCT: 45.7 % (ref 39.0–52.0)
Hemoglobin: 13.3 g/dL (ref 13.0–17.0)
Immature Granulocytes: 0 %
Lymphocytes Relative: 11 %
Lymphs Abs: 1 K/uL (ref 0.7–4.0)
MCH: 19.6 pg — ABNORMAL LOW (ref 26.0–34.0)
MCHC: 29.1 g/dL — ABNORMAL LOW (ref 30.0–36.0)
MCV: 67.2 fL — ABNORMAL LOW (ref 80.0–100.0)
Monocytes Absolute: 0.5 K/uL (ref 0.1–1.0)
Monocytes Relative: 5 %
Neutro Abs: 7.5 K/uL (ref 1.7–7.7)
Neutrophils Relative %: 81 %
Platelet Count: 667 K/uL — ABNORMAL HIGH (ref 150–400)
RBC: 6.8 MIL/uL — ABNORMAL HIGH (ref 4.22–5.81)
RDW: 19.4 % — ABNORMAL HIGH (ref 11.5–15.5)
WBC Count: 9.4 K/uL (ref 4.0–10.5)
nRBC: 0 % (ref 0.0–0.2)

## 2024-01-02 NOTE — Telephone Encounter (Signed)
 Patient has been scheduled for follow-up visit per 01/02/24 LOS.  Pt given an appt calendar with date and time.

## 2024-01-02 NOTE — Patient Instructions (Signed)

## 2024-01-04 ENCOUNTER — Encounter: Payer: Self-pay | Admitting: Oncology

## 2024-03-26 ENCOUNTER — Encounter: Payer: Self-pay | Admitting: Oncology

## 2024-04-01 NOTE — Progress Notes (Signed)
 " [facility] at [facility] [address] [place],  [place]  [address] [phone]  Clinic Day:  04/02/2024  Referring physician: Jackolyn Darice BROCKS, FNP  HISTORY OF PRESENT ILLNESS:  The patient is a 54 y.o. 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  Over these past months, he has been receiving periodic phlebotomies to keep his hematocrit below 45.  He comes in today to reassess his red cell indices.  Since his last visit, the patient has been doing well.  He has tolerated past phlebotomies very well.  He denies having any hyperviscosity-type complications related to his underlying polycythemia rubra vera.    PHYSICAL EXAM:  Blood pressure (!) 134/96, pulse 73, temperature 98.2 F (36.8 C), temperature source Oral, resp. rate 16, height 5' 11 (1.803 m), weight 236 lb (107 kg), SpO2 97%. Wt Readings from Last 3 Encounters:  04/02/24 236 lb (107 kg)  01/02/24 228 lb 3.2 oz (103.5 kg)  11/04/23 233 lb 4 oz (105.8 kg)   Body mass index is 32.92 kg/m. Performance status (ECOG): 0 - Asymptomatic Physical Exam Constitutional:      Appearance: Normal appearance. He is not ill-appearing.  HENT:     Mouth/Throat:     Mouth: Mucous membranes are moist.     Pharynx: Oropharynx is clear. No oropharyngeal exudate or posterior oropharyngeal erythema.  Cardiovascular:     Rate and Rhythm: Normal rate and regular rhythm.     Heart sounds: No murmur heard.    No friction rub. No gallop.  Pulmonary:     Effort: Pulmonary effort is normal. No respiratory distress.     Breath sounds: Normal breath sounds. No wheezing, rhonchi or rales.  Abdominal:     General: Bowel sounds are normal. There is no distension.     Palpations: Abdomen is soft. There is no mass.     Tenderness: There is no abdominal tenderness.  Musculoskeletal:        General: No swelling.     Right lower leg: No edema.     Left lower leg: No edema.  Lymphadenopathy:      Cervical: No cervical adenopathy.     Upper Body:     Right upper body: No supraclavicular or axillary adenopathy.     Left upper body: No supraclavicular or axillary adenopathy.     Lower Body: No right inguinal adenopathy. No left inguinal adenopathy.  Skin:    General: Skin is warm.     Coloration: Skin is not jaundiced.     Findings: No lesion or rash.  Neurological:     General: No focal deficit present.     Mental Status: He is alert and oriented to person, place, and time. Mental status is at baseline.  Psychiatric:        Mood and Affect: Mood normal.        Behavior: Behavior normal.        Thought Content: Thought content normal.    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  Interpretation of this result should be made in the context of  other clinical, morphologic, and cytogenetic findings.    ASSESSMENT & PLAN:  A 53 y.o. 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  When evaluating this gentleman's labs today, his hematocrit is barely above 45.  Based upon this, I will arrange for him to be phlebotomized today, which should cover him over these next handful of months.  I will see him back in 4 months to reassess his red cell indices.  The patient understands all  the plans discussed today and is in agreement with them.  Bryana Froemming DELENA Kerns, MD       "

## 2024-04-02 ENCOUNTER — Inpatient Hospital Stay: Payer: Self-pay

## 2024-04-02 ENCOUNTER — Encounter: Payer: Self-pay | Admitting: Oncology

## 2024-04-02 ENCOUNTER — Telehealth: Payer: Self-pay | Admitting: Oncology

## 2024-04-02 ENCOUNTER — Other Ambulatory Visit: Payer: Self-pay | Admitting: Oncology

## 2024-04-02 ENCOUNTER — Inpatient Hospital Stay: Payer: Self-pay | Attending: Oncology | Admitting: Oncology

## 2024-04-02 VITALS — BP 134/91 | HR 76 | Resp 18

## 2024-04-02 VITALS — BP 134/96 | HR 73 | Temp 98.2°F | Resp 16 | Ht 71.0 in | Wt 236.0 lb

## 2024-04-02 DIAGNOSIS — D45 Polycythemia vera: Secondary | ICD-10-CM

## 2024-04-02 LAB — CBC WITH DIFFERENTIAL (CANCER CENTER ONLY)
Abs Immature Granulocytes: 0.03 K/uL (ref 0.00–0.07)
Basophils Absolute: 0.1 K/uL (ref 0.0–0.1)
Basophils Relative: 1 %
Eosinophils Absolute: 0.3 K/uL (ref 0.0–0.5)
Eosinophils Relative: 3 %
HCT: 45.6 % (ref 39.0–52.0)
Hemoglobin: 12.8 g/dL — ABNORMAL LOW (ref 13.0–17.0)
Immature Granulocytes: 0 %
Lymphocytes Relative: 17 %
Lymphs Abs: 1.5 K/uL (ref 0.7–4.0)
MCH: 18.8 pg — ABNORMAL LOW (ref 26.0–34.0)
MCHC: 28.1 g/dL — ABNORMAL LOW (ref 30.0–36.0)
MCV: 67 fL — ABNORMAL LOW (ref 80.0–100.0)
Monocytes Absolute: 0.5 K/uL (ref 0.1–1.0)
Monocytes Relative: 6 %
Neutro Abs: 6.4 K/uL (ref 1.7–7.7)
Neutrophils Relative %: 73 %
Platelet Count: 658 K/uL — ABNORMAL HIGH (ref 150–400)
RBC: 6.81 MIL/uL — ABNORMAL HIGH (ref 4.22–5.81)
RDW: 21.6 % — ABNORMAL HIGH (ref 11.5–15.5)
WBC Count: 8.8 K/uL (ref 4.0–10.5)
nRBC: 0 % (ref 0.0–0.2)

## 2024-04-02 NOTE — Progress Notes (Signed)
 Gabriel Huynh presents today for phlebotomy per MD orders. Phlebotomy procedure started at 1615 and ended at 1635. 500 grams removed. Patient observed for 5 minutes after procedure without any incident. Patient tolerated procedure well. IV needle removed intact.  #18 LEFT AC

## 2024-04-02 NOTE — Telephone Encounter (Signed)
 Patient has been scheduled for follow-up visit per 04/02/2024 LOS.  Pt given an appt calendar with date and time.

## 2024-04-04 ENCOUNTER — Encounter: Payer: Self-pay | Admitting: Oncology

## 2024-07-30 ENCOUNTER — Inpatient Hospital Stay

## 2024-07-30 ENCOUNTER — Inpatient Hospital Stay: Admitting: Oncology
